# Patient Record
Sex: Female | Born: 1995 | Hispanic: Yes | Marital: Single | Smoking: Never smoker
Health system: Southern US, Community
[De-identification: ages and names within clinical notes are randomized; demographics above are authoritative.]

## PROBLEM LIST (undated history)

## (undated) DIAGNOSIS — Z23 Encounter for immunization: Secondary | ICD-10-CM

## (undated) DIAGNOSIS — R87629 Unspecified abnormal cytological findings in specimens from vagina: Secondary | ICD-10-CM

## (undated) DIAGNOSIS — A630 Anogenital (venereal) warts: Secondary | ICD-10-CM

## (undated) DIAGNOSIS — J45909 Unspecified asthma, uncomplicated: Secondary | ICD-10-CM

## (undated) DIAGNOSIS — R0600 Dyspnea, unspecified: Secondary | ICD-10-CM

## (undated) DIAGNOSIS — F419 Anxiety disorder, unspecified: Secondary | ICD-10-CM

## (undated) DIAGNOSIS — A749 Chlamydial infection, unspecified: Secondary | ICD-10-CM

## (undated) DIAGNOSIS — L709 Acne, unspecified: Secondary | ICD-10-CM

## (undated) DIAGNOSIS — F32A Depression, unspecified: Secondary | ICD-10-CM

## (undated) HISTORY — PX: NO PAST SURGERIES: SHX2092

## (undated) HISTORY — DX: Chlamydial infection, unspecified: A74.9

## (undated) HISTORY — DX: Acne, unspecified: L70.9

## (undated) HISTORY — DX: Anxiety disorder, unspecified: F41.9

## (undated) HISTORY — DX: Encounter for immunization: Z23

## (undated) HISTORY — DX: Dyspnea, unspecified: R06.00

## (undated) HISTORY — DX: Unspecified abnormal cytological findings in specimens from vagina: R87.629

## (undated) HISTORY — PX: CERVICAL BIOPSY: SHX590

## (undated) HISTORY — DX: Anogenital (venereal) warts: A63.0

## (undated) HISTORY — DX: Depression, unspecified: F32.A

## (undated) HISTORY — DX: Unspecified asthma, uncomplicated: J45.909

---
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
## 2020-08-29 DIAGNOSIS — E282 Polycystic ovarian syndrome: Secondary | ICD-10-CM

## 2020-08-29 HISTORY — DX: Polycystic ovarian syndrome: E28.2

## 2020-09-30 ENCOUNTER — Telehealth: Payer: Self-pay

## 2020-09-30 NOTE — Telephone Encounter (Signed)
[facility] referring for Genital Warts desires cryotherapy for removal . Called and left voicemail for patient to call back to be scheduled.

## 2020-10-09 ENCOUNTER — Encounter: Payer: Self-pay | Admitting: Obstetrics and Gynecology

## 2020-10-13 ENCOUNTER — Encounter: Payer: Self-pay | Admitting: Obstetrics and Gynecology

## 2020-10-13 ENCOUNTER — Ambulatory Visit (INDEPENDENT_AMBULATORY_CARE_PROVIDER_SITE_OTHER): Payer: 59 | Admitting: Obstetrics and Gynecology

## 2020-10-13 ENCOUNTER — Other Ambulatory Visit: Payer: Self-pay

## 2020-10-13 VITALS — BP 115/70 | Ht 60.5 in | Wt 232.4 lb

## 2020-10-13 DIAGNOSIS — A630 Anogenital (venereal) warts: Secondary | ICD-10-CM

## 2020-10-13 NOTE — Patient Instructions (Addendum)
Human Papillomavirus Quadrivalent Vaccine suspension for injection What is this medicine? HUMAN PAPILLOMAVIRUS VACCINE (HYOO muhn pap uh LOH muh vahy ruhs vak SEEN) is a vaccine. It is used to prevent infections of four types of the human papillomavirus. In women, the vaccine may lower your risk of getting cervical, vaginal, vulvar, or anal cancer and genital warts. In men, the vaccine may lower your risk of getting genital warts and anal cancer. You cannot get these diseases from the vaccine. This vaccine does not treat these diseases. This medicine may be used for other purposes; ask your health care provider or pharmacist if you have questions. COMMON BRAND NAME(S): Gardasil What should I tell my health care provider before I take this medicine? They need to know if you have any of these conditions:  fever or infection  hemophilia  HIV infection or AIDS  immune system problems  low platelet count  an unusual reaction to Human Papillomavirus Vaccine, yeast, other medicines, foods, dyes, or preservatives  pregnant or trying to get pregnant  breast-feeding How should I use this medicine? This vaccine is for injection in a muscle on your upper arm or thigh. It is given by a health care professional. You will be observed for 15 minutes after each dose. Sometimes, fainting happens after the vaccine is given. You may be asked to sit or lie down during the 15 minutes. Three doses are given. The second dose is given 2 months after the first dose. The last dose is given 4 months after the second dose. A copy of a Vaccine Information Statement will be given before each vaccination. Read this sheet carefully each time. The sheet may change frequently. Talk to your pediatrician regarding the use of this medicine in children. While this drug may be prescribed for children as young as 9 years of age for selected conditions, precautions do apply. Overdosage: If you think you have taken too much of this  medicine contact a poison control center or emergency room at once. NOTE: This medicine is only for you. Do not share this medicine with others. What if I miss a dose? All 3 doses of the vaccine should be given within 6 months. Remember to keep appointments for follow-up doses. Your health care provider will tell you when to return for the next vaccine. Ask your health care professional for advice if you are unable to keep an appointment or miss a scheduled dose. What may interact with this medicine?  other vaccines This list may not describe all possible interactions. Give your health care provider a list of all the medicines, herbs, non-prescription drugs, or dietary supplements you use. Also tell them if you smoke, drink alcohol, or use illegal drugs. Some items may interact with your medicine. What should I watch for while using this medicine? This vaccine may not fully protect everyone. Continue to have regular pelvic exams and cervical or anal cancer screenings as directed by your doctor. The Human Papillomavirus is a sexually transmitted disease. It can be passed by any kind of sexual activity that involves genital contact. The vaccine works best when given before you have any contact with the virus. Many people who have the virus do not have any signs or symptoms. Tell your doctor or health care professional if you have any reaction or unusual symptom after getting the vaccine. What side effects may I notice from receiving this medicine? Side effects that you should report to your doctor or health care professional as soon as possible:    allergic reactions like skin rash, itching or hives, swelling of the face, lips, or tongue  breathing problems  feeling faint or lightheaded, falls Side effects that usually do not require medical attention (report to your doctor or health care professional if they continue or are  bothersome):  cough  dizziness  fever  headache  nausea  redness, warmth, swelling, pain, or itching at site where injected This list may not describe all possible side effects. Call your doctor for medical advice about side effects. You may report side effects to FDA at 1-800-FDA-1088. Where should I keep my medicine? This drug is given in a hospital or clinic and will not be stored at home. NOTE: This sheet is a summary. It may not cover all possible information. If you have questions about this medicine, talk to your doctor, pharmacist, or health care provider.  2021 Elsevier/Gold Standard (2013-10-07 13:14:33) Cryosurgery for Skin Conditions Cryosurgery, also called cryotherapy, is the use of extremely cold liquid (liquid nitrogen) to freeze and remove abnormal or diseased tissue. Cryosurgery may be used to remove certain growths on the skin, such as:  Warts.  Skin sores that could turn into cancer (precancerous skin lesions or actinic keratoses).  Some skin cancers. Cryosurgery usually takes a few minutes, and it can be done in your health care provider's office. Tell a health care provider about:  Any allergies you have.  All medicines you are taking, including vitamins, herbs, eye drops, creams, and over-the-counter medicines.  Any problems you or family members have had with anesthetic medicines.  Any blood disorders you have.  Any surgeries you have had.  Any medical conditions you have.  Whether you are pregnant or may be pregnant. What are the risks? Generally, this is a safe procedure. However, problems may occur, including:  Infection.  Bleeding.  Scarring.  Changes in skin color (lighter or darker than normal skin tone).  Swelling.  Hair loss in the treated area.  Damage to nearby structures or organs, such as nerve damage and loss of feeling. This is rare. What happens before the procedure? No specific preparation is needed for this  procedure. Your health care provider will describe the procedure and will discuss the benefits and risks of the procedure with you. What happens during the procedure?  Your procedure will be performed using one of the following methods: ? Your health care provider may apply a device (probe) to the skin. The probe has liquid nitrogen flowing through it to cool it down. The probe will be applied to the skin until the skin is frozen and destroyed. ? Your health care provider may apply liquid nitrogen to the skin with a swab or by spraying it on the skin until the skin is frozen and destroyed.  The treated area may be covered with a bandage (dressing). These procedures may vary among health care providers and clinics.   What can I expect after procedure? After your procedure, it is common to have redness, swelling, and a blister that forms over the treated area. The blister may contain a small amount of blood. You may also have some mild stinging or a burning sensation that will resolve. If a blister forms, it will break open on its own after about 2-4 weeks, leaving a scab. Then the treated area will heal. After healing, there is usually little or no scarring. Follow these instructions at home: Caring for the treated area  Follow instructions from your health care provider about how to take care   of the treated area. If you have a dressing, make sure you: ? Wash your hands with soap and water for at least 20 seconds before and after you change your dressing. If soap and water are not available, use hand sanitizer. ? Change your dressing as told by your health care provider. ? Keep the dressing and the treated area clean and dry. If the dressing gets wet, change it right away. ? Clean the treated area with soap and water. ? Keep the area covered with a dressing until it heals, or for as long as told by your health care provider.  Check the treated area every day for signs of infection. Check  for: ? More redness, swelling, or pain. ? More fluid or blood. ? Warmth. ? Pus or a bad smell.  If a blister forms, do not pick at your blister or try to break it open. Doing this can cause infection and scarring.  Do not apply any medicine, cream, or lotion to the treated area unless directed by your health care provider.   General instructions  Take over-the-counter and prescription medicines only as told by your health care provider.  Do not use any products that contain nicotine or tobacco, such as cigarettes, e-cigarettes, and chewing tobacco. These can delay healing. If you need help quitting, ask your health care provider.  Do not take baths, swim, use a hot tub, hand-wash dishes, or otherwise soak the treated area until your health care provider approves. Ask your health care provider if you may take showers. You may only be allowed to take sponge baths.  Keep all follow-up visits as told by your health care provider. This is important. Contact a health care provider if:  You have more redness, swelling, or pain around the treated area.  You have more fluid or blood coming from the treated area.  The treated area feels warm to the touch.  You have pus or a bad smell coming from the treated area.  Your blister becomes large and painful. Get help right away if:  You have a fever and have redness spreading from the treated area. Summary  Cryosurgery, also called cryotherapy, is the use of extreme cold (liquid nitrogen) to freeze and remove abnormal growths or diseased tissue.  Cryosurgery usually takes a few minutes, and it can be done in your health care provider's office.  Generally, this is a safe procedure that requires no specific preparation beforehand.  There are two different methods for performing cryosurgery. One method involves using a device (probe) to freeze the growth, and the other method involves applying liquid nitrogen directly to the growth.  After  treatment with cryotherapy, follow care instructions as provided by your health care provider. Watch for signs of infection. If a blister forms, do not pick at it or try to break it open. This information is not intended to replace advice given to you by your health care provider. Make sure you discuss any questions you have with your health care provider. Document Revised: 04/03/2019 Document Reviewed: 04/03/2019 Elsevier Patient Education  2021 Elsevier Inc.  

## 2020-10-13 NOTE — Progress Notes (Signed)
Patient ID: Angela Lawrence, female   DOB: 31-Oct-1995, 25 y.o.   MRN: [id number]  Reason for Consult: Gynecologic Exam   Referred by Gorden Harms, PA-C  Subjective:     HPI:  Angela Lawrence is a 25 y.o. female. She was referred from the health department for cryotherapy. She reports she has one genital wart and she would like to have it removed.   Past Medical History:  Diagnosis Date  . PCOS (polycystic ovarian syndrome) 08/2020   History reviewed. No pertinent family history. History reviewed. No pertinent surgical history.  Short Social History:  Social History   Tobacco Use  . Smoking status: Never Smoker  . Smokeless tobacco: Never Used  Substance Use Topics  . Alcohol use: Yes    Comment: occ    Not on File  No current outpatient medications on file.   No current facility-administered medications for this visit.    Review of Systems  Constitutional: Negative for chills, fatigue, fever and unexpected weight change.  HENT: Negative for trouble swallowing.  Eyes: Negative for loss of vision.  Respiratory: Negative for cough, shortness of breath and wheezing.  Cardiovascular: Negative for chest pain, leg swelling, palpitations and syncope.  GI: Negative for abdominal pain, blood in stool, diarrhea, nausea and vomiting.  GU: Negative for difficulty urinating, dysuria, frequency and hematuria.  Musculoskeletal: Negative for back pain, leg pain and joint pain.  Skin: Negative for rash.  Neurological: Negative for dizziness, headaches, light-headedness, numbness and seizures.  Psychiatric: Negative for behavioral problem, confusion, depressed mood and sleep disturbance.        Objective:  Objective   Vitals:   10/13/20 1529  BP: 115/70  Weight: 232 lb 6.4 oz (105.4 kg)  Height: 5' 0.5" (1.537 m)   Body mass index is 44.64 kg/m.  Physical Exam Vitals and nursing note reviewed.  Constitutional:      Appearance: She is  well-developed and well-nourished.  HENT:     Head: Normocephalic and atraumatic.  Eyes:     Extraocular Movements: EOM normal.     Pupils: Pupils are equal, round, and reactive to light.  Cardiovascular:     Rate and Rhythm: Normal rate and regular rhythm.  Pulmonary:     Effort: Pulmonary effort is normal. No respiratory distress.  Skin:    General: Skin is warm and dry.  Neurological:     Mental Status: She is alert and oriented to person, place, and time.  Psychiatric:        Mood and Affect: Mood and affect normal.        Behavior: Behavior normal.        Thought Content: Thought content normal.        Judgment: Judgment normal.      GYNECOLOGY CLINIC PROCEDURE NOTE  Cryotherapy details  Indication: Pt has a history of Genital Warts  The indications for cryotherapy were reviewed with the patient in detail. She was counseled about that efficacy of this procedure, and possible need for repeat cryotherapy. The risks of the procedure where explained in detail and recovery was explained. All her questions were answered, and written informed consent was obtained.  The patient was placed in the dorsal lithotomy position. Genital warts were visualized. The appropriate cryotherapy probes were picked and then affixed to cryotherapy apparatus. Nitrogen gas was then activated, the probe was applied to the genital warts. This was kept in place for 30 seconds on each lesion. 1 lesions were treated in total.  The cryotherapy was then stopped and all instruments were removed.   The patient tolerated the procedure well without any complications. Routine post procedure instructions were given to the patient.  Will follow up in 1 month.    Assessment/Plan:     25 yo with genital wart Cryotherapy performed today  Follow up in 4 weeks.   Adelene Idler MD [facility], [facility] 10/13/2020 4:01 PM

## 2020-10-16 ENCOUNTER — Encounter: Payer: Self-pay | Admitting: Obstetrics and Gynecology

## 2020-10-16 ENCOUNTER — Telehealth: Payer: Self-pay

## 2020-10-16 NOTE — Telephone Encounter (Signed)
sent 

## 2020-10-16 NOTE — Telephone Encounter (Signed)
Pt calling; wants note for work for today b/c of procedure she had done Tues; is swollen down there today; her job requires sitting down; doesn't want to tell them she has a problem down there.  She told work she had an appt with [place] today; please send note thru MyChart. [phone]

## 2020-10-23 ENCOUNTER — Encounter: Payer: Self-pay | Admitting: Obstetrics and Gynecology

## 2020-11-13 ENCOUNTER — Ambulatory Visit: Payer: 59 | Admitting: Obstetrics and Gynecology

## 2020-11-24 ENCOUNTER — Ambulatory Visit: Payer: 59 | Admitting: Obstetrics and Gynecology

## 2020-12-25 ENCOUNTER — Ambulatory Visit: Payer: 59 | Admitting: Obstetrics and Gynecology

## 2022-09-27 ENCOUNTER — Telehealth: Payer: Self-pay

## 2022-09-27 NOTE — Telephone Encounter (Signed)
Pt calling to see if we are accepting new pts.  [phone]  Left detailed msg that she is not considered a new pt since we last saw her in 2022.  If she is calling for a friend - if preg we will take her but if not would need a referral.

## 2022-11-16 NOTE — Progress Notes (Unsigned)
   PCP:  Crissie Figures, PA-C   No chief complaint on file.    HPI:      Ms. Angela Lawrence is a 27 y.o. No obstetric history on file. whose LMP was No LMP recorded., presents today for her annual examination.  Her menses are {norm/abn:715}, lasting {number: 22536} days.  Dysmenorrhea {dysmen:716}. She {does:18564} have intermenstrual bleeding.  Sex activity: {sex active: 315163}.  Last Pap: [id number]  Results were: {norm/abn:16707::"no abnormalities"} /neg HPV DNA *** Hx of STDs: ext HPV with cryotx 2022  There is no FH of breast cancer. There is no FH of ovarian cancer. The patient {does:18564} do self-breast exams.  Tobacco use: {tob:20664} Alcohol use: {Alcohol:11675} No drug use.  Exercise: {exercise:31265}  She {does:18564} get adequate calcium and Vitamin D in her diet.  There are no problems to display for this patient.   No past surgical history on file.  No family history on file.  Social History   Socioeconomic History   Marital status: Single    Spouse name: Not on file   Number of children: Not on file   Years of education: Not on file   Highest education level: Not on file  Occupational History   Not on file  Tobacco Use   Smoking status: Never   Smokeless tobacco: Never  Vaping Use   Vaping Use: Never used  Substance and Sexual Activity   Alcohol use: Yes    Comment: occ   Drug use: Never   Sexual activity: Yes    Birth control/protection: Condom  Other Topics Concern   Not on file  Social History Narrative   ** Merged History Encounter **       Social Determinants of Health   Financial Resource Strain: Not on file  Food Insecurity: Not on file  Transportation Needs: Not on file  Physical Activity: Not on file  Stress: Not on file  Social Connections: Not on file  Intimate Partner Violence: Not on file    No current outpatient medications on file.     ROS:  Review of Systems BREAST: No  symptoms   Objective: There were no vitals taken for this visit.   OBGyn Exam  Results: No results found for this or any previous visit (from the past 24 hour(s)).  Assessment/Plan: No diagnosis found.  No orders of the defined types were placed in this encounter.            GYN counsel {counseling: 16159}     F/U  No follow-ups on file.  Warda Mcqueary B. Fredi Geiler, PA-C 11/16/2022 5:43 PM

## 2022-11-17 ENCOUNTER — Other Ambulatory Visit (HOSPITAL_COMMUNITY)
Admission: RE | Admit: 2022-11-17 | Discharge: 2022-11-17 | Disposition: A | Discharge status: Home / Self Care | Payer: BC Managed Care – PPO | Source: Ambulatory Visit | Attending: Obstetrics and Gynecology | Admitting: Obstetrics and Gynecology

## 2022-11-17 ENCOUNTER — Encounter: Payer: Self-pay | Admitting: Obstetrics and Gynecology

## 2022-11-17 ENCOUNTER — Ambulatory Visit (INDEPENDENT_AMBULATORY_CARE_PROVIDER_SITE_OTHER): Payer: BC Managed Care – PPO | Admitting: Obstetrics and Gynecology

## 2022-11-17 VITALS — BP 100/80 | Ht 66.0 in | Wt 232.0 lb

## 2022-11-17 DIAGNOSIS — Z113 Encounter for screening for infections with a predominantly sexual mode of transmission: Secondary | ICD-10-CM | POA: Insufficient documentation

## 2022-11-17 DIAGNOSIS — Z30432 Encounter for removal of intrauterine contraceptive device: Secondary | ICD-10-CM | POA: Diagnosis not present

## 2022-11-17 DIAGNOSIS — A630 Anogenital (venereal) warts: Secondary | ICD-10-CM

## 2022-11-17 DIAGNOSIS — Z124 Encounter for screening for malignant neoplasm of cervix: Secondary | ICD-10-CM | POA: Insufficient documentation

## 2022-11-17 DIAGNOSIS — Z01419 Encounter for gynecological examination (general) (routine) without abnormal findings: Secondary | ICD-10-CM | POA: Diagnosis not present

## 2022-11-17 DIAGNOSIS — Z30013 Encounter for initial prescription of injectable contraceptive: Secondary | ICD-10-CM

## 2022-11-17 DIAGNOSIS — R1031 Right lower quadrant pain: Secondary | ICD-10-CM

## 2022-11-17 MED ORDER — MEDROXYPROGESTERONE ACETATE 150 MG/ML IM SUSY
150.0000 mg | PREFILLED_SYRINGE | INTRAMUSCULAR | Status: AC
  Administered 2022-11-17: 150 mg via INTRAMUSCULAR

## 2022-11-17 NOTE — Patient Instructions (Signed)
I value your feedback and you entrusting us with your care. If you get a [facility] patient survey, I would appreciate you taking the time to let us know about your experience today. Thank you! ? ? ?

## 2022-11-24 LAB — CYTOLOGY - PAP
Comment: NORMAL
Neisseria Gonorrhea: NEGATIVE

## 2022-11-24 NOTE — Progress Notes (Signed)
Pls have lab add HPV DNA. Thx.

## 2022-11-25 LAB — CYTOLOGY - PAP
Chlamydia: NEGATIVE
Comment: NEGATIVE
Comment: NORMAL
Neisseria Gonorrhea: NEGATIVE

## 2022-12-01 ENCOUNTER — Ambulatory Visit (INDEPENDENT_AMBULATORY_CARE_PROVIDER_SITE_OTHER): Payer: BC Managed Care – PPO

## 2022-12-01 DIAGNOSIS — R1031 Right lower quadrant pain: Secondary | ICD-10-CM | POA: Diagnosis not present

## 2022-12-01 DIAGNOSIS — Z719 Counseling, unspecified: Secondary | ICD-10-CM | POA: Diagnosis not present

## 2022-12-05 NOTE — Progress Notes (Signed)
Pt aware of pap results. Admin, pls call pt to schedule colpo with MD. She is aware.

## 2022-12-06 ENCOUNTER — Telehealth: Payer: Self-pay | Admitting: Obstetrics and Gynecology

## 2022-12-06 NOTE — Telephone Encounter (Signed)
The patient is scheduled for 5/15 with [facility]

## 2022-12-06 NOTE — Telephone Encounter (Signed)
I contacted this patient to scheduled for her Colposcopy. Dr. Valentino Saxon has an opening on 5/8 at 10:15 am that I was going to offer. I had to leave voicemail for the patient to call back to be scheduled.

## 2022-12-09 DIAGNOSIS — Z1389 Encounter for screening for other disorder: Secondary | ICD-10-CM | POA: Diagnosis not present

## 2022-12-09 DIAGNOSIS — F331 Major depressive disorder, recurrent, moderate: Secondary | ICD-10-CM | POA: Diagnosis not present

## 2022-12-23 DIAGNOSIS — Z1389 Encounter for screening for other disorder: Secondary | ICD-10-CM | POA: Diagnosis not present

## 2022-12-23 DIAGNOSIS — F331 Major depressive disorder, recurrent, moderate: Secondary | ICD-10-CM | POA: Diagnosis not present

## 2023-01-10 NOTE — Progress Notes (Deleted)
    GYNECOLOGY OFFICE COLPOSCOPY PROCEDURE NOTE  27 y.o. G0P0000 here for colposcopy for low-grade squamous intraepithelial neoplasia (LGSIL - encompassing HPV,mild dysplasia,CIN I) pap smear on 11/17/2022. Discussed role for HPV in cervical dysplasia, need for surveillance.  Patient gave informed written consent, time out was performed.  Placed in lithotomy position. Cervix viewed with speculum and colposcope after application of acetic acid.   Colposcopy adequate? {yes/no:20286}  {Findings; colposcopy:728}; corresponding biopsies obtained.  ECC specimen obtained. All specimens were labeled and sent to pathology.  Chaperone was present during entire procedure.  Patient was given post procedure instructions.  Will follow up pathology and manage accordingly; patient will be contacted with results and recommendations.  Routine preventative health maintenance measures emphasized.    Hildred Laser, MD [facility]

## 2023-01-11 ENCOUNTER — Ambulatory Visit: Payer: BC Managed Care – PPO | Admitting: Obstetrics and Gynecology

## 2023-01-11 NOTE — Telephone Encounter (Signed)
I contacted the patient via phone about the missed appointment for today, 5/15 with Dr. Valentino Saxon. I left message for the patient to call back to rescheduled. Opening in August for next available appointments.

## 2023-02-05 ENCOUNTER — Ambulatory Visit
Admission: RE | Admit: 2023-02-05 | Discharge: 2023-02-05 | Disposition: A | Discharge status: Home / Self Care | Payer: BC Managed Care – PPO | Source: Ambulatory Visit | Attending: Urgent Care | Admitting: Urgent Care

## 2023-02-05 VITALS — BP 116/79 | HR 82 | Temp 99.7°F | Resp 16

## 2023-02-05 DIAGNOSIS — R079 Chest pain, unspecified: Secondary | ICD-10-CM | POA: Diagnosis not present

## 2023-02-05 DIAGNOSIS — R0789 Other chest pain: Secondary | ICD-10-CM | POA: Diagnosis not present

## 2023-02-05 DIAGNOSIS — R0602 Shortness of breath: Secondary | ICD-10-CM

## 2023-02-05 MED ORDER — LIDOCAINE VISCOUS HCL 2 % MT SOLN
15.0000 mL | Freq: Once | OROMUCOSAL | Status: AC
  Administered 2023-02-05: 15 mL via OROMUCOSAL

## 2023-02-05 MED ORDER — ALUMINUM & MAGNESIUM HYDROXIDE 200-200 MG/5ML PO SUSP
30.0000 mL | Freq: Once | ORAL | Status: AC
  Administered 2023-02-05: 30 mL via ORAL

## 2023-02-05 NOTE — ED Triage Notes (Signed)
Pt c/o left side chest pain for the past 2 days. She describes the as a burning sensation. It hurts her to take a deep breath in. She reports SOB and lightheadedness with the chest pain. Pt denies taking any medication for her symptoms.

## 2023-02-05 NOTE — Discharge Instructions (Addendum)
Recommended use of pain relievers such as ibuprofen or naproxen for relief of your chest wall pain.  If you develop worsening symptoms, go to the emergency room.

## 2023-02-05 NOTE — ED Provider Notes (Signed)
[facility] Angela Lawrence    CSN: [id number] Arrival date & time: 02/05/23  1012      History   Chief Complaint Chief Complaint  Patient presents with   Chest Pain    HPI Angela Lawrence is a 27 y.o. female.    Chest Pain   Patient presents to urgent care with complaint of left-sided chest pain x 2 days.  Describes pain as "a burning sensation".  Endorses pain when taking a deep breath and reports feeling of shortness of breath and lightheadedness.  Patient endorses PMH including GERD but denies symptoms of GERD currently.  No taste of acid or increased burping.  No epigastric pain.  Past Medical History:  Diagnosis Date   Acne    Chlamydia    Genital warts    PCOS (polycystic ovarian syndrome) 08/2020   Vaccine for human papilloma virus (HPV) types 6, 11, 16, and 18 administered     Patient Active Problem List   Diagnosis Date Noted   Genital warts 11/17/2022    Past Surgical History:  Procedure Laterality Date   NO PAST SURGERIES      OB History     Gravida  0   Para  0   Term  0   Preterm  0   AB  0   Living  0      SAB  0   IAB  0   Ectopic  0   Multiple  0   Live Births  0            Home Medications    Prior to Admission medications   Not on File    Family History No family history on file.  Social History Social History   Tobacco Use   Smoking status: Never   Smokeless tobacco: Never  Vaping Use   Vaping Use: Never used  Substance Use Topics   Alcohol use: Not Currently   Drug use: Never     Allergies   Patient has no known allergies.   Review of Systems Review of Systems  Cardiovascular:  Positive for chest pain.     Physical Exam Triage Vital Signs ED Triage Vitals  Enc Vitals Group     BP 02/05/23 1036 116/79     Pulse Rate 02/05/23 1036 82     Resp 02/05/23 1036 16     Temp 02/05/23 1036 99.7 F (37.6 C)     Temp Source 02/05/23 1036 Oral     SpO2 02/05/23 1036 97 %     Weight --       Height --      Head Circumference --      Peak Flow --      Pain Score 02/05/23 1034 0     Pain Loc --      Pain Edu? --      Excl. in GC? --    No data found.  Updated Vital Signs BP 116/79 (BP Location: Left Arm)   Pulse 82   Temp 99.7 F (37.6 C) (Oral)   Resp 16   SpO2 97%   Visual Acuity Right Eye Distance:   Left Eye Distance:   Bilateral Distance:    Right Eye Near:   Left Eye Near:    Bilateral Near:     Physical Exam Vitals reviewed.  Constitutional:      Appearance: She is well-developed.  Cardiovascular:     Rate and Rhythm: Normal rate and regular rhythm.  Heart sounds: Normal heart sounds.  Pulmonary:     Effort: Pulmonary effort is normal.     Breath sounds: Normal breath sounds.  Chest:     Chest wall: Tenderness present. No mass.    Skin:    General: Skin is warm and dry.  Neurological:     General: No focal deficit present.     Mental Status: She is alert and oriented to person, place, and time.  Psychiatric:        Mood and Affect: Mood normal.        Behavior: Behavior normal.      UC Treatments / Results  Labs (all labs ordered are listed, but only abnormal results are displayed) Labs Reviewed - No data to display  EKG   Radiology No results found.  Procedures Procedures (including critical care time)  Medications Ordered in UC Medications - No data to display  Initial Impression / Assessment and Plan / UC Course  I have reviewed the triage vital signs and the nursing notes.  Pertinent labs & imaging results that were available during my care of the patient were reviewed by me and considered in my medical decision making (see chart for details).   Angela Lawrence is a 27 y.o. female presenting with L sided chest wall pain. Patient is afebrile without recent antipyretics, satting well on room air. Overall is well appearing though non-toxic, well hydrated, without respiratory distress. Pulmonary exam is  unremarkable.  Lungs CTAB without wheezing, rhonchi, rales. RRR.  Left sided chest wall pain is present.  Point tenderness on the upper left breast with palpation.  Reviewed relevant chart history.   ECG is reassuring.  Normal sinus rhythm.  Low suspicion for ACS or other cardiac etiology given her age and past medical history.  GI cocktail consisting of mag aluminum and viscous lidocaine was administered top differential diagnosis is costochondritis of unknown etiology.  She denies any new physical activities or exercise regimens.  No new activities at work or home.  Recommend use of OTC NSAIDs for relief of her chest wall pain.  Counseled patient on potential for adverse effects with medications prescribed/recommended today, ER and return-to-clinic precautions discussed, patient verbalized understanding and agreement with care plan.  Final Clinical Impressions(s) / UC Diagnoses   Final diagnoses:  Chest pain, unspecified type  SOB (shortness of breath)   Discharge Instructions   None    ED Prescriptions   None    PDMP not reviewed this encounter.   Charma Igo, [place] 02/05/23 1108

## 2023-02-09 ENCOUNTER — Ambulatory Visit: Payer: BC Managed Care – PPO

## 2023-12-11 DIAGNOSIS — L83 Acanthosis nigricans: Secondary | ICD-10-CM | POA: Insufficient documentation

## 2023-12-11 DIAGNOSIS — E282 Polycystic ovarian syndrome: Secondary | ICD-10-CM | POA: Insufficient documentation

## 2023-12-11 DIAGNOSIS — E559 Vitamin D deficiency, unspecified: Secondary | ICD-10-CM | POA: Insufficient documentation

## 2024-04-05 ENCOUNTER — Ambulatory Visit: Payer: Self-pay

## 2024-05-24 ENCOUNTER — Ambulatory Visit

## 2024-05-24 VITALS — BP 124/65 | Ht 65.0 in | Wt 242.0 lb

## 2024-05-24 DIAGNOSIS — Z308 Encounter for other contraceptive management: Secondary | ICD-10-CM | POA: Diagnosis not present

## 2024-05-24 DIAGNOSIS — Z3201 Encounter for pregnancy test, result positive: Secondary | ICD-10-CM | POA: Diagnosis not present

## 2024-05-24 LAB — PREGNANCY, URINE: Preg Test, Ur: POSITIVE — AB

## 2024-05-24 MED ORDER — PRENATAL 27-0.8 MG PO TABS: 1.0000 | ORAL_TABLET | Freq: Every day | ORAL | Status: AC

## 2024-05-24 NOTE — Progress Notes (Signed)
 UPT positive. Plans to receive prenatal care at [facility]; sent to clerk for preadmission.   The patient was dispensed prenatal vitamins #100 today. I provided counseling today regarding the medication. We discussed the medication, the side effects and when to call clinic.   Positive pregnancy packet reviewed and given to patient. Also counseled on hydration and when to seek medical attention.   Patient given the opportunity to ask questions. Questions answered.   Abagail Kitchens, RN

## 2024-06-12 ENCOUNTER — Emergency Department

## 2024-06-12 ENCOUNTER — Other Ambulatory Visit: Payer: Self-pay

## 2024-06-12 ENCOUNTER — Emergency Department
Admission: EM | Admit: 2024-06-12 | Discharge: 2024-06-12 | Disposition: A | Discharge status: Home / Self Care | Attending: Emergency Medicine | Admitting: Emergency Medicine

## 2024-06-12 DIAGNOSIS — Z3A08 8 weeks gestation of pregnancy: Secondary | ICD-10-CM | POA: Diagnosis not present

## 2024-06-12 DIAGNOSIS — R1031 Right lower quadrant pain: Secondary | ICD-10-CM | POA: Diagnosis not present

## 2024-06-12 DIAGNOSIS — J069 Acute upper respiratory infection, unspecified: Secondary | ICD-10-CM | POA: Insufficient documentation

## 2024-06-12 DIAGNOSIS — O209 Hemorrhage in early pregnancy, unspecified: Secondary | ICD-10-CM | POA: Insufficient documentation

## 2024-06-12 DIAGNOSIS — O99511 Diseases of the respiratory system complicating pregnancy, first trimester: Secondary | ICD-10-CM | POA: Insufficient documentation

## 2024-06-12 DIAGNOSIS — B9789 Other viral agents as the cause of diseases classified elsewhere: Secondary | ICD-10-CM | POA: Insufficient documentation

## 2024-06-12 DIAGNOSIS — O4690 Antepartum hemorrhage, unspecified, unspecified trimester: Secondary | ICD-10-CM

## 2024-06-12 LAB — COMPREHENSIVE METABOLIC PANEL WITH GFR
ALT: 17 U/L (ref 0–44)
AST: 19 U/L (ref 15–41)
Albumin: 4 g/dL (ref 3.5–5.0)
Alkaline Phosphatase: 38 U/L (ref 38–126)
Anion gap: 12 (ref 5–15)
BUN: 9 mg/dL (ref 6–20)
CO2: 21 mmol/L — ABNORMAL LOW (ref 22–32)
Calcium: 8.9 mg/dL (ref 8.9–10.3)
Chloride: 104 mmol/L (ref 98–111)
Creatinine, Ser: 0.52 mg/dL (ref 0.44–1.00)
GFR, Estimated: >60 mL/min (ref >60)
Glucose, Bld: 129 mg/dL — ABNORMAL HIGH (ref 70–99)
Potassium: 3.5 mmol/L (ref 3.5–5.1)
Sodium: 137 mmol/L (ref 135–145)
Total Bilirubin: 0.4 mg/dL (ref 0.0–1.2)
Total Protein: 7.3 g/dL (ref 6.5–8.1)

## 2024-06-12 LAB — ABO/RH: ABO/RH(D): O POS

## 2024-06-12 LAB — URINALYSIS, ROUTINE W REFLEX MICROSCOPIC
Bilirubin Urine: NEGATIVE
Glucose, UA: NEGATIVE mg/dL
Ketones, ur: NEGATIVE mg/dL
Nitrite: NEGATIVE
Protein, ur: NEGATIVE mg/dL
Specific Gravity, Urine: 1.02 (ref 1.005–1.030)
pH: 7 (ref 5.0–8.0)

## 2024-06-12 LAB — CBC
HCT: 41.1 % (ref 36.0–46.0)
Hemoglobin: 14 g/dL (ref 12.0–15.0)
MCH: 29.8 pg (ref 26.0–34.0)
MCHC: 34.1 g/dL (ref 30.0–36.0)
MCV: 87.4 fL (ref 80.0–100.0)
Platelets: 269 K/uL (ref 150–400)
RBC: 4.7 MIL/uL (ref 3.87–5.11)
RDW: 12.3 % (ref 11.5–15.5)
WBC: 10.1 K/uL (ref 4.0–10.5)
nRBC: 0 % (ref 0.0–0.2)

## 2024-06-12 LAB — HCG, QUANTITATIVE, PREGNANCY: hCG, Beta Chain, Quant, S: 157395 m[IU]/mL — ABNORMAL HIGH (ref <5)

## 2024-06-12 LAB — LIPASE, BLOOD: Lipase: 29 U/L (ref 11–51)

## 2024-06-12 LAB — RESP PANEL BY RT-PCR (RSV, FLU A&B, COVID)  RVPGX2
Influenza A by PCR: NEGATIVE
Influenza B by PCR: NEGATIVE
Resp Syncytial Virus by PCR: NEGATIVE
SARS Coronavirus 2 by RT PCR: NEGATIVE

## 2024-06-12 LAB — POC URINE PREG, ED: Preg Test, Ur: POSITIVE — AB

## 2024-06-12 LAB — GROUP A STREP BY PCR: Group A Strep by PCR: NOT DETECTED

## 2024-06-12 NOTE — Discharge Instructions (Signed)
 While you are pregnant you may only take allergy medicine for your cold symptoms.  If you begin to cough you can use Robitussin.  Tylenol for body aches, pain or fever.  Drink plenty of fluids. Your ultrasound today was reassuring Your labs are reassuring, you does not have strep throat, COVID, influenza, or RSV

## 2024-06-12 NOTE — ED Provider Notes (Signed)
 [facility] Provider Note    Event Date/Time   First MD Initiated Contact with Patient 06/12/24 [phone]     (approximate)   History   Sore Throat, Vaginal Bleeding, and Abdominal Pain   HPI  Angela Lawrence is a 28 y.o. female G1, P0 presents emergency department with right lower abdominal pain, vaginal bleeding, states scant bleeding with pink color.  Also presents with fever, chills, sore throat and cough.  Unsure around anybody else is sick.  States somebody at work went home sick yesterday.  No vomiting or diarrhea.  Does have decreased appetite but that has been ongoing since she became pregnant.  Is followed at [facility]      Physical Exam   Triage Vital Signs: ED Triage Vitals  Encounter Vitals Group     BP 06/12/24 0737 122/79     Girls Systolic BP Percentile --      Girls Diastolic BP Percentile --      Boys Systolic BP Percentile --      Boys Diastolic BP Percentile --      Pulse Rate 06/12/24 0737 79     Resp 06/12/24 0737 20     Temp 06/12/24 0737 98 F (36.7 C)     Temp Source 06/12/24 0737 Oral     SpO2 06/12/24 0737 100 %     Weight 06/12/24 0739 238 lb (108 kg)     Height 06/12/24 0739 5' 5 (1.651 m)     Head Circumference --      Peak Flow --      Pain Score 06/12/24 0737 4     Pain Loc --      Pain Education --      Exclude from Growth Chart --     Most recent vital signs: Vitals:   06/12/24 1110 06/12/24 1138  BP: 117/65 120/70  Pulse: 86 83  Resp: 18 17  Temp: 98.3 F (36.8 C) 98.1 F (36.7 C)  SpO2: 98% 98%     General: Awake, no distress.   CV:  Good peripheral perfusion.  Resp:  Normal effort.  Abd:  No distention.  Mildly tender right lower quadrant Other:      ED Results / Procedures / Treatments   Labs (all labs ordered are listed, but only abnormal results are displayed) Labs Reviewed  COMPREHENSIVE METABOLIC PANEL WITH GFR - Abnormal; Notable for the following components:      Result Value    CO2 21 (*)    Glucose, Bld 129 (*)    All other components within normal limits  URINALYSIS, ROUTINE W REFLEX MICROSCOPIC - Abnormal; Notable for the following components:   Color, Urine YELLOW (*)    APPearance CLOUDY (*)    Hgb urine dipstick MODERATE (*)    Leukocytes,Ua LARGE (*)    Bacteria, UA MANY (*)    All other components within normal limits  HCG, QUANTITATIVE, PREGNANCY - Abnormal; Notable for the following components:   hCG, Beta Chain, Quant, S [id number] (*)    All other components within normal limits  POC URINE PREG, ED - Abnormal; Notable for the following components:   Preg Test, Ur POSITIVE (*)    All other components within normal limits  GROUP A STREP BY PCR  RESP PANEL BY RT-PCR (RSV, FLU A&B, COVID)  RVPGX2  LIPASE, BLOOD  CBC  ABO/RH     EKG     RADIOLOGY Ultrasound OB less than 14-week  PROCEDURES:   Procedures  Critical Care:  no Chief Complaint  Patient presents with   Sore Throat   Vaginal Bleeding   Abdominal Pain      MEDICATIONS ORDERED IN ED: Medications - No data to display   IMPRESSION / MDM / ASSESSMENT AND PLAN / ED COURSE  I reviewed the triage vital signs and the nursing notes.                              Differential diagnosis includes, but is not limited to, vaginal bleeding in pregnancy, ectopic, threatened miscarriage, subchorionic hemorrhage, COVID, influenza, RSV  Patient's presentation is most consistent with acute illness / injury with system symptoms.   Cardiac monitor no Medications given:  Labs, imaging ordered   Patient's labs are reassuring  Did independently review interpret the ultrasound OB less than 14 weeks, positive IUP, positive viable gestation, radiologist comments small subchorionic hemorrhage  I did explain all these findings to the patient.  She is to follow-up with her regular doctor.  Return emergency department for worsening.  She is in agreement treatment plan.  Gave her a work  note states she should not lift more than 10 to 15 pounds.  Discharged stable condition.   FINAL CLINICAL IMPRESSION(S) / ED DIAGNOSES   Final diagnoses:  Vaginal bleeding in pregnancy  Viral URI     Rx / DC Orders   ED Discharge Orders     None        Note:  This document was prepared using Dragon voice recognition software and may include unintentional dictation errors.    Gasper Devere ORN, PA-C 06/12/24 1407    Viviann Pastor, MD 06/13/24 [phone]

## 2024-06-12 NOTE — ED Triage Notes (Signed)
 Pt to ED for sore throat and vaginal spotting (pink) since last night at [redacted] weeks pregnant. States she had a fever but did not check temp. Also endorses sharp RLQ pain since last night. Hx PCOS.

## 2024-06-28 ENCOUNTER — Ambulatory Visit: Admitting: Family Medicine

## 2024-06-28 ENCOUNTER — Encounter: Payer: Self-pay | Admitting: Family Medicine

## 2024-06-28 VITALS — BP 101/70 | HR 70 | Temp 97.4°F | Ht 64.5 in | Wt 237.0 lb

## 2024-06-28 DIAGNOSIS — Z23 Encounter for immunization: Secondary | ICD-10-CM

## 2024-06-28 DIAGNOSIS — O208 Other hemorrhage in early pregnancy: Secondary | ICD-10-CM | POA: Diagnosis not present

## 2024-06-28 DIAGNOSIS — Z3401 Encounter for supervision of normal first pregnancy, first trimester: Secondary | ICD-10-CM | POA: Diagnosis not present

## 2024-06-28 DIAGNOSIS — O99210 Obesity complicating pregnancy, unspecified trimester: Secondary | ICD-10-CM | POA: Insufficient documentation

## 2024-06-28 DIAGNOSIS — N72 Inflammatory disease of cervix uteri: Secondary | ICD-10-CM

## 2024-06-28 DIAGNOSIS — O99211 Obesity complicating pregnancy, first trimester: Secondary | ICD-10-CM

## 2024-06-28 DIAGNOSIS — Z8709 Personal history of other diseases of the respiratory system: Secondary | ICD-10-CM

## 2024-06-28 DIAGNOSIS — Z3400 Encounter for supervision of normal first pregnancy, unspecified trimester: Secondary | ICD-10-CM | POA: Insufficient documentation

## 2024-06-28 DIAGNOSIS — Z8279 Family history of other congenital malformations, deformations and chromosomal abnormalities: Secondary | ICD-10-CM

## 2024-06-28 LAB — HEMOGLOBIN, FINGERSTICK: Hemoglobin: 13.6 g/dL (ref 11.1–15.9)

## 2024-06-28 MED ORDER — ASPIRIN 81 MG PO TBEC: 81.0000 mg | DELAYED_RELEASE_TABLET | Freq: Every day | ORAL | Status: AC

## 2024-06-28 NOTE — Progress Notes (Addendum)
 Centering Tour given. In house hgb result reviewed during visit. Aware of next Centering Group meeting time. The patient was dispensed Baby Aspirin today. I provided counseling today regarding the medication. We discussed the medication, the side effects and when to call clinic. Patient given the opportunity to ask questions. Questions answered.Peak Flow performed: 240, 300 and 300 with adequate effort. BTHIELE RN

## 2024-06-28 NOTE — Progress Notes (Signed)
 [facility] [address] [place] [address] Main phone: [phone]  Initial Prenatal Visit  Subjective:  Angela Lawrence is a 28 y.o. G1P0000 at [redacted]w[redacted]d being seen today to start prenatal care at the [facility]. The following medical issues will be considered in the care of this low-risk pregnancy:   Patient Active Problem List   Diagnosis Date Noted   Supervision of normal first pregnancy, antepartum 06/28/2024   Obesity affecting pregnancy, antepartum, pregravid BMI= 38.8 06/28/2024   Subchorionic hemorrhage of placenta in first trimester 06/28/2024   Family history of Down syndrome 06/28/2024   Chronic cervicitis 06/28/2024   History of asthma 06/28/2024   PCOS (polycystic ovarian syndrome) 12/11/2023   Vitamin D deficiency 12/11/2023   Acanthosis nigricans 12/11/2023   Genital warts 11/17/2022   Patient reports intermittent spotting and intermittent SOB.  Contractions: Not present. Vag. Bleeding: None, Scant (reports intermittent spotting).  Movement: Absent. Denies leaking of fluid.   Indications for ASA therapy One of the following: Previous pregnancy with preeclampsia, especially early onset and with an adverse outcome No  Multifetal gestation No  Chronic hypertension No  Type 1 or 2 diabetes mellitus No  Chronic kidney disease No  Autoimmune disease (antiphospholipid syndrome, systemic lupus erythematosus) No   Two or more of the following: Nulliparity  Yes  Obesity (body mass index >30 kg/m2) Yes  Family history of preeclampsia in mother or sister No  Age >=35 years No  Sociodemographic characteristics (African American race, low socioeconomic level) Yes  Personal risk factors (eg, previous pregnancy with low birth weight or small for gestational age infant, previous adverse pregnancy outcome [eg, stillbirth], interval >10 years between pregnancies) No   The following  portions of the patient's history were reviewed and updated as appropriate: allergies, current medications, past family history, past medical history, past social history, past surgical history and problem list. Problem list updated.  Objective:   Vitals:   06/28/24 0853 06/28/24 0857  BP: 101/70   Pulse: 70   Temp: (!) 97.4 F (36.3 C)   Weight: 237 lb (107.5 kg)   Height:  5' 4.5 (1.638 m)   Fetal Status: Fetal Heart Rate (bpm): 160   Movement: Absent      Physical Exam Vitals and nursing note reviewed. Exam conducted with a chaperone present (Katie Eilleen Primrose RN).  Constitutional:      General: She is not in acute distress.    Appearance: Normal appearance. She is well-developed.  HENT:     Head: Normocephalic and atraumatic.     Right Ear: External ear normal.     Left Ear: External ear normal.     Nose: Nose normal. No congestion or rhinorrhea.     Mouth/Throat:     Lips: Pink.     Mouth: Mucous membranes are moist.     Dentition: Normal dentition. No dental caries.     Pharynx: Oropharynx is clear. Uvula midline.     Comments: Dentition: good  Eyes:     General: No scleral icterus.    Conjunctiva/sclera: Conjunctivae normal.  Neck:     Thyroid: No thyroid mass or thyromegaly.  Cardiovascular:     Rate and Rhythm: Normal rate.     Pulses: Normal pulses.     Comments: Extremities are warm and well perfused Pulmonary:     Effort: Pulmonary effort is normal.     Breath sounds: Normal breath sounds.  Chest:  Chest wall: No mass.  Breasts:    Tanner Score is 5.     Breasts are symmetrical.     Right: Normal. No mass, nipple discharge or skin change.     Left: Normal. No mass, nipple discharge or skin change.  Abdominal:     General: Abdomen is flat.     Palpations: Abdomen is soft.     Tenderness: There is no abdominal tenderness.     Comments: Gravid   Genitourinary:    Uterus: Enlarged (Gravid 11 size).      Comments: Pelvic exam not  indicated Musculoskeletal:     Right lower leg: No edema.     Left lower leg: No edema.  Lymphadenopathy:     Cervical: No cervical adenopathy.     Upper Body:     Right upper body: No axillary adenopathy.     Left upper body: No axillary adenopathy.  Skin:    General: Skin is warm.     Capillary Refill: Capillary refill takes less than 2 seconds.  Neurological:     Mental Status: She is alert.     Assessment and Plan:  Pregnancy: G1P0000 at [redacted]w[redacted]d  1. Supervision of normal first pregnancy, antepartum (Primary) 28 year old female in clinic today for prenatal care. Anxious but happy- support from her mom and dad. -dating by LMP (confirmed with U/S 10/15) -Patient states she is taking PNV daily. -Patient reports some nausea and vomiting in AM- resource sheet given- reviewed Vitamin B6 as first line tx -Patient received dental care years ago.  Patient given dental information.   -Patient states she received COVID vaccines in the past x 3- reviewed recommendation for booster shot.  Patient to receive Flu vaccine today. -EDPS= 7, will continue to monitor.  -Hgb= wnl    - Prenatal Profile I - Lead, blood (adult age 32 yrs or greater) - Hemoglobinopathy evaluation -[id number] - Hemoglobin, fingerstick - Inheritest 14-Gene Panel - MaterniT21 PLUS Core  2. Obesity affecting pregnancy, antepartum, unspecified obesity type -ASA indicated- accepts- dispensed to start 07/02/24 -total weight gain of 11-20# discussed today -encouraged exercise such as walking 30 mins daily  - aspirin EC 81 MG tablet; Take 1 tablet (81 mg total) by mouth daily. Swallow whole.  3. Subchorionic hemorrhage of placenta in first trimester -seen on US  10/15 (likely cause of bleeding) -continues to have intermittent spotting, reviewed bleeding precautions for when to return to clinic/ER  4. Chronic cervicitis -colpo in May 2025 with CIN1- repeat pap in 1 year   5. Family history of Down syndrome -reports  partners brother may have Down syndrome and her second cousin has Down syndrome -reviewed NIPT test -discuss GC at RV  - MaterniT21 PLUS Core  6. History of asthma -last reported albuterol use at age 61 -no wheezing today, but reports exertional SOB- likely physiologic with pregnancy      Discussed overview of care and coordination with inpatient delivery practices including [facility],  [facility].   Reviewed Centering pregnancy as standard of care at [facility], oriented to room and showed video. Based on EDD, plan for Cycle  .   Preterm labor symptoms and general obstetric precautions including but not limited to vaginal bleeding, contractions, leaking of fluid and fetal movement were reviewed in detail with the patient.  Please refer to After Visit Summary for other counseling recommendations.   Return in about 4 weeks (around 07/26/2024) for Routine Prenatal Care.  No future appointments.  [place],  FNP

## 2024-06-29 LAB — PROTEIN / CREATININE RATIO, URINE
Creatinine, Urine: 176.3 mg/dL
Protein, Ur: 45.4 mg/dL
Protein/Creat Ratio: 258 mg/g{creat} — ABNORMAL HIGH (ref 0–200)

## 2024-07-01 ENCOUNTER — Ambulatory Visit: Payer: Self-pay | Admitting: Family Medicine

## 2024-07-01 DIAGNOSIS — Z3400 Encounter for supervision of normal first pregnancy, unspecified trimester: Secondary | ICD-10-CM

## 2024-07-02 ENCOUNTER — Encounter

## 2024-07-10 LAB — MICROSCOPIC EXAMINATION
Casts: NONE SEEN /LPF
Epithelial Cells (non renal): >10 /HPF — AB (ref 0–10)
RBC, Urine: NONE SEEN /HPF (ref 0–2)

## 2024-07-10 LAB — HGB A1C W/O EAG: Hgb A1c MFr Bld: 5.3 % (ref 4.8–5.6)

## 2024-07-10 LAB — MATERNIT 21 PLUS CORE, BLOOD
Fetal Fraction: 15
Result (T21): NEGATIVE
Trisomy 13 (Patau syndrome): NEGATIVE
Trisomy 18 (Edwards syndrome): NEGATIVE
Trisomy 21 (Down syndrome): NEGATIVE

## 2024-07-10 LAB — PREGNANCY, INITIAL SCREEN
Antibody Screen: NEGATIVE
Basophils Absolute: 0.1 x10E3/uL (ref 0.0–0.2)
Basos: 1 %
Bilirubin, UA: NEGATIVE
Chlamydia trachomatis, NAA: NEGATIVE
EOS (ABSOLUTE): 0.2 x10E3/uL (ref 0.0–0.4)
Eos: 3 %
Glucose, UA: NEGATIVE
HCV Ab: NONREACTIVE
HIV Screen 4th Generation wRfx: NONREACTIVE
Hematocrit: 41.5 % (ref 34.0–46.6)
Hemoglobin: 14 g/dL (ref 11.1–15.9)
Hepatitis B Surface Ag: NEGATIVE
Immature Grans (Abs): 0.1 x10E3/uL (ref 0.0–0.1)
Immature Granulocytes: 1 %
Lymphocytes Absolute: 1.9 x10E3/uL (ref 0.7–3.1)
Lymphs: 23 %
MCH: 30.4 pg (ref 26.6–33.0)
MCHC: 33.7 g/dL (ref 31.5–35.7)
MCV: 90 fL (ref 79–97)
Monocytes Absolute: 0.5 x10E3/uL (ref 0.1–0.9)
Monocytes: 6 %
Neisseria Gonorrhoeae by PCR: NEGATIVE
Neutrophils Absolute: 5.5 x10E3/uL (ref 1.4–7.0)
Neutrophils: 66 %
Nitrite, UA: NEGATIVE
Platelets: 261 x10E3/uL (ref 150–450)
RBC, UA: NEGATIVE
RBC: 4.61 x10E6/uL (ref 3.77–5.28)
RDW: 12.7 % (ref 11.7–15.4)
RPR Ser Ql: NONREACTIVE
Rh Factor: POSITIVE
Rubella Antibodies, IGG: 1.06 {index} (ref >0.99)
Specific Gravity, UA: 1.025 (ref 1.005–1.030)
Urobilinogen, Ur: 1 mg/dL (ref 0.2–1.0)
WBC: 8.2 x10E3/uL (ref 3.4–10.8)
pH, UA: 7 (ref 5.0–7.5)

## 2024-07-10 LAB — HCV INTERPRETATION

## 2024-07-10 LAB — COMPREHENSIVE METABOLIC PANEL WITH GFR
ALT: 14 IU/L (ref 0–32)
AST: 13 IU/L (ref 0–40)
Albumin: 4.1 g/dL (ref 4.0–5.0)
Alkaline Phosphatase: 45 IU/L (ref 41–116)
BUN/Creatinine Ratio: 16 (ref 9–23)
BUN: 8 mg/dL (ref 6–20)
Bilirubin Total: 0.4 mg/dL (ref 0.0–1.2)
CO2: 21 mmol/L (ref 20–29)
Calcium: 9.5 mg/dL (ref 8.7–10.2)
Chloride: 101 mmol/L (ref 96–106)
Creatinine, Ser: 0.49 mg/dL — ABNORMAL LOW (ref 0.57–1.00)
Globulin, Total: 2.7 g/dL (ref 1.5–4.5)
Glucose: 95 mg/dL (ref 70–99)
Potassium: 3.8 mmol/L (ref 3.5–5.2)
Sodium: 136 mmol/L (ref 134–144)
Total Protein: 6.8 g/dL (ref 6.0–8.5)
eGFR: 132 mL/min/1.73 (ref >59)

## 2024-07-10 LAB — BEACON CARRIER SCREEN;14 GENES

## 2024-07-10 LAB — URINE CULTURE, OB REFLEX

## 2024-07-10 LAB — HGB FRACTIONATION CASCADE
Hgb A2: 2.5 % (ref 1.8–3.2)
Hgb A: 97.5 % (ref 96.4–98.8)
Hgb F: 0 % (ref 0.0–2.0)
Hgb S: 0 %

## 2024-07-10 LAB — LEAD, BLOOD (ADULT >= 16 YRS): Lead-Whole Blood: <1 ug/dL (ref 0.0–3.4)

## 2024-07-10 LAB — GLUCOSE, 1 HOUR GESTATIONAL: Gestational Diabetes Screen: 91 mg/dL (ref 70–139)

## 2024-07-10 LAB — TSH: TSH: 0.727 u[IU]/mL (ref 0.450–4.500)

## 2024-07-11 ENCOUNTER — Telehealth: Payer: Self-pay

## 2024-07-11 NOTE — Telephone Encounter (Signed)
 Received message that patient cannot come to Centering on 07/24/24 and would like a revisit in clinic. LM with number to call back.Izetta Parish, RN

## 2024-07-14 ENCOUNTER — Encounter: Payer: Self-pay | Admitting: Family Medicine

## 2024-07-23 ENCOUNTER — Encounter: Payer: Self-pay | Admitting: Family Medicine

## 2024-07-23 ENCOUNTER — Ambulatory Visit: Admitting: Family Medicine

## 2024-07-23 VITALS — BP 109/68 | HR 82 | Temp 97.3°F | Wt 243.2 lb

## 2024-07-23 DIAGNOSIS — Z3A15 15 weeks gestation of pregnancy: Secondary | ICD-10-CM

## 2024-07-23 DIAGNOSIS — Z8279 Family history of other congenital malformations, deformations and chromosomal abnormalities: Secondary | ICD-10-CM

## 2024-07-23 DIAGNOSIS — Z3400 Encounter for supervision of normal first pregnancy, unspecified trimester: Secondary | ICD-10-CM

## 2024-07-23 DIAGNOSIS — L299 Pruritus, unspecified: Secondary | ICD-10-CM

## 2024-07-23 NOTE — Progress Notes (Signed)
 Patient here for MH RV at 15 weeks. Still considering pediatrician. Desires AFP today and needs urine prot/creat today. Hulan Parish, RN

## 2024-07-23 NOTE — Progress Notes (Signed)
 [facility] [address], [address] [place] [address] Main phone: [phone]  Prenatal Visit  Subjective:  Angela Lawrence is a 28 y.o. G1P0000 at [redacted]w[redacted]d being seen today for ongoing prenatal care.  She is currently monitored for the following issues for this low-risk pregnancy:   Patient Active Problem List   Diagnosis Date Noted   Supervision of normal first pregnancy, antepartum 06/28/2024   Obesity affecting pregnancy, antepartum, pregravid BMI= 38.8 06/28/2024   Subchorionic hemorrhage of placenta in first trimester 06/28/2024   Family history of Down syndrome 06/28/2024   Chronic cervicitis 06/28/2024   History of asthma 06/28/2024   PCOS (polycystic ovarian syndrome) 12/11/2023   Vitamin D deficiency 12/11/2023   Acanthosis nigricans 12/11/2023   Genital warts 11/17/2022   HPI Patient reports NV after eating meals, mandarin fruit helps relief her nausea, swelling around ankles while standing during her workday, and  Intermittent itching all over particularly on legs stomach, back and face, she has not tried any moisturizers to alleviate itching. She has not used any new detergents or soaps lately. This itching on her skin started ~ 1 month ago.Pt Denies leaking of fluid/ROM or vaginal bleeding.   The following portions of the patient's history were reviewed and updated as appropriate: allergies, current medications, past family history, past medical history, past social history, past surgical history and problem list. Problem list updated.  Objective:   Vitals:   07/23/24 1305  BP: 109/68  Pulse: 82  Temp: (!) 97.3 F (36.3 C)  Weight: 243 lb 3.2 oz (110.3 kg)    Fetal Status: Fetal Heart Rate (bpm):  (FHT visualized by butterfly bedside US .) Fundal Height:  (one finger breath below the umbilicus) Movement: Absent     General:  Alert, oriented and cooperative. Patient is in no acute distress.   Skin: Skin is warm and dry. No rash noted.   Cardiovascular: Normal heart rate noted  Respiratory: Normal respiratory effort, no problems with respiration noted  Abdomen: Soft, gravid, appropriate for gestational age.  Pain/Pressure: Absent     Pelvic: Cervical exam deferred        Extremities: Normal range of motion.  Edema: Trace  Mental Status: Normal mood and affect. Normal behavior. Normal judgment and thought content.   Assessment and Plan:  Pregnancy: G1P0000 at [redacted]w[redacted]d  1. [redacted] weeks gestation of pregnancy (Primary) -RV in 4 weeks.   2. Supervision of normal first pregnancy, antepartum  -TWG: 13 lb 3.2 oz (5.987 kg) Has gained 6  lbs in 4 weeks.   -Continues to take PNV and low-dose ASA without concerns.  - Recommended OTC Vitamin b6, ginger tea, eating small frequent meals, and saltine crackers at bedside for NV symptoms. PT able to tolerate meals, PO fluids adequately 4-5 bottles of water per/day, low-threshold concern for dehydration. -Advised for swelling to implement compression stockings, proper footwear, and elevating feet at night. -Referral for [facility] at [facility] placed 07/24/24.  - AFP, Serum, Open Spina Bifida - Protein / creatinine ratio, urine  3. Family history of Down syndrome -Reviewed negative NIPS and carrier screen results. - Offered Genetic Counseling, pt declined.   4. Pruritus -Baseline labs ordered at visit, will continue to trend as needed if pruritus persists. -Encouraged to use CeraVe moisturizer to help with suspected dry skin. - Bile acids, total - Comprehensive metabolic panel with GFR   Preterm labor symptoms and general obstetric precautions including but not limited to vaginal bleeding, contractions,  leaking of fluid and fetal movement were reviewed in detail with the patient. Please refer to After Visit Summary for other counseling recommendations.  No follow-ups on file.  Future Appointments  Date Time Provider Department Center   08/20/2024  9:30 AM [facility] PROVIDER [facility] None  08/26/2024 11:00 AM [facility] PROVIDER [facility] None    Dan Scearce GORMAN Pouch, NP  Attestation of Supervision of Advanced Practitioner (CNM/PA/NP): Evaluation and management procedures were performed by the Advanced Practice Provider under my supervision and collaboration.  I have reviewed the Advanced Practice Provider's note and chart, and I agree with the management and plan. I have also made any necessary editorial changes.   I was working along side this practitioner all day and all medical plans were discussed with me.   Verneta Bers, [place]

## 2024-07-24 ENCOUNTER — Ambulatory Visit

## 2024-07-27 LAB — COMPREHENSIVE METABOLIC PANEL WITH GFR
ALT: 13 IU/L (ref 0–32)
AST: 12 IU/L (ref 0–40)
Albumin: 4.1 g/dL (ref 4.0–5.0)
Alkaline Phosphatase: 47 IU/L (ref 41–116)
BUN/Creatinine Ratio: 13 (ref 9–23)
BUN: 7 mg/dL (ref 6–20)
Bilirubin Total: 0.3 mg/dL (ref 0.0–1.2)
CO2: 22 mmol/L (ref 20–29)
Calcium: 9.5 mg/dL (ref 8.7–10.2)
Chloride: 100 mmol/L (ref 96–106)
Creatinine, Ser: 0.52 mg/dL — ABNORMAL LOW (ref 0.57–1.00)
Globulin, Total: 2.5 g/dL (ref 1.5–4.5)
Glucose: 110 mg/dL — ABNORMAL HIGH (ref 70–99)
Potassium: 3.8 mmol/L (ref 3.5–5.2)
Sodium: 136 mmol/L (ref 134–144)
Total Protein: 6.6 g/dL (ref 6.0–8.5)
eGFR: 131 mL/min/1.73 (ref >59)

## 2024-07-27 LAB — AFP, SERUM, OPEN SPINA BIFIDA
AFP MoM: 1.26
AFP Value: 27 ng/mL
Gest. Age on Collection Date: 15 wk
Maternal Age At EDD: 27.5 a
OSBR Risk 1 IN: 5411
Test Results:: NEGATIVE
Weight: 243 [lb_av]

## 2024-07-27 LAB — PROTEIN / CREATININE RATIO, URINE
Creatinine, Urine: 90.3 mg/dL
Protein, Ur: 9 mg/dL
Protein/Creat Ratio: 100 mg/g{creat} (ref 0–200)

## 2024-07-27 LAB — BILE ACIDS, TOTAL: Bile Acids Total: 3 umol/L (ref 0.0–10.0)

## 2024-07-29 ENCOUNTER — Ambulatory Visit: Payer: Self-pay | Admitting: Family Medicine

## 2024-07-29 DIAGNOSIS — Z3400 Encounter for supervision of normal first pregnancy, unspecified trimester: Secondary | ICD-10-CM

## 2024-08-01 NOTE — Addendum Note (Signed)
 Addended by: Randeep Biondolillo on: 08/01/2024 01:50 PM   Modules accepted: Orders

## 2024-08-19 ENCOUNTER — Telehealth: Payer: Self-pay | Admitting: Family Medicine

## 2024-08-20 ENCOUNTER — Ambulatory Visit

## 2024-08-26 ENCOUNTER — Ambulatory Visit: Admitting: Nurse Practitioner

## 2024-08-26 VITALS — BP 128/75 | HR 100 | Temp 97.7°F | Wt 245.8 lb

## 2024-08-26 DIAGNOSIS — L709 Acne, unspecified: Secondary | ICD-10-CM

## 2024-08-26 DIAGNOSIS — O99210 Obesity complicating pregnancy, unspecified trimester: Secondary | ICD-10-CM

## 2024-08-26 DIAGNOSIS — Z3A19 19 weeks gestation of pregnancy: Secondary | ICD-10-CM

## 2024-08-26 DIAGNOSIS — Z3400 Encounter for supervision of normal first pregnancy, unspecified trimester: Secondary | ICD-10-CM

## 2024-08-26 DIAGNOSIS — O99212 Obesity complicating pregnancy, second trimester: Secondary | ICD-10-CM

## 2024-08-26 DIAGNOSIS — Z3402 Encounter for supervision of normal first pregnancy, second trimester: Secondary | ICD-10-CM

## 2024-08-26 NOTE — Progress Notes (Signed)
 " [facility] [address], [address] [place] [address] Main phone: [phone]  Prenatal Visit  Subjective:  Angela Lawrence is a 28 y.o. G1P0000 at [redacted]w[redacted]d being seen today for ongoing prenatal care.  She is currently monitored for the following issues for this low-risk pregnancy:   Patient Active Problem List   Diagnosis Date Noted   Supervision of normal first pregnancy, antepartum 06/28/2024   Obesity affecting pregnancy, antepartum, pregravid BMI= 38.8 06/28/2024   Subchorionic hemorrhage of placenta in first trimester 06/28/2024   Family history of Down syndrome 06/28/2024   Chronic cervicitis 06/28/2024   History of asthma 06/28/2024   PCOS (polycystic ovarian syndrome) 12/11/2023   Vitamin D deficiency 12/11/2023   Acanthosis nigricans 12/11/2023   Genital warts 11/17/2022   HPI Patient reports itching.  Contractions: Not present. Vag. Bleeding: None.  Movement: Absent. Denies leaking of fluid/ROM.   The following portions of the patient's history were reviewed and updated as appropriate: allergies, current medications, past family history, past medical history, past social history, past surgical history and problem list. Problem list updated.  Objective:   Vitals:   08/26/24 1050  BP: 128/75  Pulse: 100  Temp: 97.7 F (36.5 C)  Weight: 245 lb 12.8 oz (111.5 kg)   Total weight gain from pre-pregnancy weight: 15 lb 12.8 oz (7.167 kg)  Fetal Status: Fetal Heart Rate (bpm): 145 Fundal Height: 20 cm Movement: Absent    Fundal height trends reviewed - appropriate for EGA  General:  Alert, oriented and cooperative. Patient is in no acute distress.  Skin: Skin is warm and dry. No rash noted.   Cardiovascular: Normal heart rate noted  Respiratory: Normal respiratory effort, no problems with respiration noted  Abdomen: Soft, gravid, appropriate for gestational age.  Pain/Pressure: Present     Pelvic:  Cervical exam deferred        Extremities: Normal range of motion.     Mental Status: Normal mood and affect. Normal behavior. Normal judgment and thought content.   Assessment and Plan:  Pregnancy: G1P0000 at [redacted]w[redacted]d  1. [redacted] weeks gestation of pregnancy (Primary)  2. Supervision of normal first pregnancy, antepartum  - Has had sore throat, congestion. No fever. Discussed comfort measures, Tylenol, lots of fluids. Has medication list. - Constipation x 1 week, drinking lots of water. Endorses lower abdominal discomfort. Last BM was roughly on the 24th of December (about 5 days). She has not been eating normally due to holiday celebrations. Constipation handout given. Advised adding Miralax for next few days to induce a bowel movement, adding daily fiber supplement such as Metamucil if continues.  - Itching continues on legs, belly arms but not worsening. Baseline bile acids/CMP normal at last visit on 07/23/24. Using moisturizer and it is helping some. When she itches, area becomes raised and a little puffy. Itching not on the hands/palms, sometimes on soles of feet. Does have skin peeling on her feet. Advised applying heavy moisturizer to her feet and applying socks afterward. Also advised use of hydrocortisone on itchy areas as needed.  - Note given for work: she is sometimes required to lift ~30 lbs, advised employer to limit her lifting to 10 lbs - Anatomy US  scheduled for 09/06/24  3. Obesity affecting pregnancy, antepartum, unspecified obesity type  - Taking aspirin  daily - TWG 15 lbs, 2 lb weight gain in 1 month - MNT referral sent. Did get some information on nutrition from [facility].   4. Acne,  unspecified acne type  - 1.5 cm non-tender, fluctuating cyst on left cheek that appeared around 1.5 months ago. Has not drained any fluid. Has acne, appears to be a small cyst.  - Advised warm compresses, not picking at area, topical salicylic acid/benzoyl peroxide and avoiding Retinoids. - Ambulatory  referral to Dermatology  Preterm labor symptoms and general obstetric precautions including but not limited to vaginal bleeding, contractions, leaking of fluid and fetal movement were reviewed in detail with the patient. Please refer to After Visit Summary for other counseling recommendations.   Return in 24 days (on 09/19/2024). Centering Pregnancy.  No future appointments.  Damien FORBES Satchel, NP "

## 2024-08-30 ENCOUNTER — Telehealth: Payer: Self-pay | Admitting: Professional Counselor

## 2024-09-03 ENCOUNTER — Encounter: Payer: Self-pay | Admitting: Nurse Practitioner

## 2024-09-03 ENCOUNTER — Telehealth: Payer: Self-pay | Admitting: Family Medicine

## 2024-09-03 ENCOUNTER — Telehealth: Payer: Self-pay

## 2024-09-03 ENCOUNTER — Other Ambulatory Visit: Payer: Self-pay | Admitting: Obstetrics and Gynecology

## 2024-09-03 DIAGNOSIS — Z6841 Body Mass Index (BMI) 40.0 and over, adult: Secondary | ICD-10-CM

## 2024-09-03 DIAGNOSIS — Z3402 Encounter for supervision of normal first pregnancy, second trimester: Secondary | ICD-10-CM

## 2024-09-03 NOTE — Telephone Encounter (Signed)
 [facility] called to notify [facility] that they are unavle to perform U/S because of BMI. Requested we fax records to [facility] [place]. Snapshot faxed to [facility] and confirmed receipt. BTHIELE RN

## 2024-09-03 NOTE — Telephone Encounter (Signed)
 Patient wants help with scheduling her ultrasound appointment, she had one in [facility] but got cancelled then she got another one in [place] but it is not till February. She would like to know if she could possibly get an ultrasound appt before February.

## 2024-09-04 ENCOUNTER — Telehealth: Payer: Self-pay

## 2024-09-04 NOTE — Telephone Encounter (Signed)
 Returned call to patient who called about U/S appointment. U/S appointment is 10/04/24 in [place] [place].  Patient would like an U/S sooner, but has already asked off for 10/04/24 at work. Patient

## 2024-09-05 NOTE — Telephone Encounter (Signed)
 Patient states she will keep 10/04/24 U/S appointment.Izetta Parish, RN

## 2024-09-06 NOTE — Telephone Encounter (Signed)
 Angela Lawrence called and aware of U/S appointment 10/04/2024. She states that she is on the Wait List if an appointment opens up sooner than this date. BTHIELE RN

## 2024-09-11 ENCOUNTER — Ambulatory Visit

## 2024-09-11 DIAGNOSIS — L700 Acne vulgaris: Secondary | ICD-10-CM

## 2024-09-11 DIAGNOSIS — Z3A22 22 weeks gestation of pregnancy: Secondary | ICD-10-CM

## 2024-09-11 DIAGNOSIS — L910 Hypertrophic scar: Secondary | ICD-10-CM | POA: Diagnosis not present

## 2024-09-11 MED ORDER — AZELAIC ACID 15 % EX GEL: CUTANEOUS | 5 refills | Status: AC

## 2024-09-11 MED ORDER — CLINDAMYCIN PHOSPHATE 1 % EX SWAB: CUTANEOUS | 5 refills | Status: AC

## 2024-09-11 NOTE — Patient Instructions (Addendum)
 We recommend an over-the-counter lotion called Amlactin.  This contains a mild acid exfoliant which can help minimize skin cell build-up.      Due to recent changes in healthcare laws, you may see results of your pathology and/or laboratory studies on MyChart before the doctors have had a chance to review them. We understand that in some cases there may be results that are confusing or concerning to you. Please understand that not all results are received at the same time and often the doctors may need to interpret multiple results in order to provide you with the best plan of care or course of treatment. Therefore, we ask that you please give us  2 business days to thoroughly review all your results before contacting the office for clarification. Should we see a critical lab result, you will be contacted sooner.   If You Need Anything After Your Visit  If you have any questions or concerns for your doctor, please call our main line at [phone] and press option 4 to reach your doctor's medical assistant. If no one answers, please leave a voicemail as directed and we will return your call as soon as possible. Messages left after 4 pm will be answered the following business day.   You may also send us  a message via MyChart. We typically respond to MyChart messages within 1-2 business days.  For prescription refills, please ask your pharmacy to contact our office. Our fax number is [phone].  If you have an urgent issue when the clinic is closed that cannot wait until the next business day, you can page your doctor at the number below.    Please note that while we do our best to be available for urgent issues outside of office hours, we are not available 24/7.   If you have an urgent issue and are unable to reach us , you may choose to seek medical care at your doctor's office, retail clinic, urgent care center, or emergency room.  If you have a medical emergency, please immediately call  911 or go to the emergency department.  Pager Numbers  - Dr. Hester: [phone]  - Dr. Jackquline: [phone]  - Dr. Claudene: [phone]   In the event of inclement weather, please call our main line at [phone] for an update on the status of any delays or closures.  Dermatology Medication Tips: Please keep the boxes that topical medications come in in order to help keep track of the instructions about where and how to use these. Pharmacies typically print the medication instructions only on the boxes and not directly on the medication tubes.   If your medication is too expensive, please contact our office at [phone] option 4 or send us  a message through MyChart.   We are unable to tell what your co-pay for medications will be in advance as this is different depending on your insurance coverage. However, we may be able to find a substitute medication at lower cost or fill out paperwork to get insurance to cover a needed medication.   If a prior authorization is required to get your medication covered by your insurance company, please allow us  1-2 business days to complete this process.  Drug prices often vary depending on where the prescription is filled and some pharmacies may offer cheaper prices.  The website www.goodrx.com contains coupons for medications through different pharmacies. The prices here do not account for what the cost may be with help from insurance (it may be cheaper with your insurance),  but the website can give you the price if you did not use any insurance.  - You can print the associated coupon and take it with your prescription to the pharmacy.  - You may also stop by our office during regular business hours and pick up a GoodRx coupon card.  - If you need your prescription sent electronically to a different pharmacy, notify our office through [facility] or by phone at [phone] option 4.     Si Usted Necesita Algo Despus de Su  Visita  Tambin puede enviarnos un mensaje a travs de Clinical cytogeneticist. Por lo general respondemos a los mensajes de MyChart en el transcurso de 1 a 2 das hbiles.  Para renovar recetas, por favor pida a su farmacia que se ponga en contacto con nuestra oficina. Randi lakes de fax es [place] [phone].  Si tiene un asunto urgente cuando la clnica est cerrada y que no puede esperar hasta el siguiente da hbil, puede llamar/localizar a su doctor(a) al nmero que aparece a continuacin.   Por favor, tenga en cuenta que aunque hacemos todo lo posible para estar disponibles para asuntos urgentes fuera del horario de [place], no estamos disponibles las 24 horas del da, los 7 [address]  [address] de la [place].   Si tiene un problema urgente y no puede comunicarse con nosotros, puede optar por buscar atencin mdica  en el consultorio de su doctor(a), en una clnica privada, en un centro de atencin urgente o en una sala de emergencias.  Si tiene Engineer, drilling, por favor llame inmediatamente al 911 o vaya a la sala de emergencias.  Nmeros de bper  - Dr. Hester: [phone]  - Dra. Jackquline: [phone]  - Dr. Claudene: [phone]   En caso de inclemencias del tiempo, por favor llame a landry capes principal al [phone] para una actualizacin sobre el [place] de cualquier retraso o cierre.  Consejos para la medicacin en dermatologa: Por favor, guarde las cajas en las que vienen los medicamentos de uso tpico para ayudarle a seguir las instrucciones sobre dnde y cmo usarlos. Las farmacias generalmente imprimen las instrucciones del medicamento slo en las cajas y no directamente en los tubos del [place].   Si su medicamento es muy caro, por favor, pngase en contacto con landry rieger llamando al [phone] y presione la opcin 4 o envenos un mensaje a travs de Clinical cytogeneticist.   No podemos decirle cul ser su copago por los medicamentos por adelantado ya que esto es diferente dependiendo de  la cobertura de su seguro. Sin embargo, es posible que podamos encontrar un medicamento sustituto a Audiological scientist un formulario para que el seguro cubra el medicamento que se considera necesario.   Si se requiere una autorizacin previa para que su compaa de seguros malta su medicamento, por favor permtanos de 1 a 2 das hbiles para completar este proceso.  Los precios de los medicamentos varan con frecuencia dependiendo del Environmental consultant de dnde se surte la receta y alguna farmacias pueden ofrecer precios ms baratos.  El sitio web www.goodrx.com tiene cupones para medicamentos de Health and safety inspector. Los precios aqu no tienen en cuenta lo que podra costar con la ayuda del seguro (puede ser ms barato con su seguro), pero el sitio web puede darle el precio si no utiliz Tourist information centre manager.  - Puede imprimir el cupn correspondiente y llevarlo con su receta a la farmacia.  - Tambin puede pasar por nuestra oficina durante el horario de atencin regular y Education officer, museum una tarjeta de  cupones de GoodRx.  - Si necesita que su receta se enve electrnicamente a una farmacia diferente, informe a nuestra oficina a travs de MyChart de [place] o por telfono llamando al [phone] y presione la opcin 4.

## 2024-09-11 NOTE — Progress Notes (Signed)
" °  °  Subjective   Angela Lawrence is a 29 y.o. female who presents for the following: acne concerns. Patient is new patient  Today patient reports: Patient states acne concerns some on body mostly on face. Has been having acne since a teenager. Has tried curology slightly helped but stopped used due to pregnancy. Possible cyst on left side of jaw. Patient has concerns of dark pigmentation in bilateral axilla and thighs. Patient is currently [redacted] weeks pregnant.  Review of Systems:    No other skin or systemic complaints except as noted in HPI or Assessment and Plan.  The following portions of the chart were reviewed this encounter and updated as appropriate: medications, allergies, medical history  Relevant Medical History:  n/a   Objective  (SKPE) Well appearing patient in no apparent distress; mood and affect are within normal limits. Examination was performed of the: Focused Exam of: face   Examination notable for: acne scarring on face, inflamed papules Keloid L jawline  Velvety brown plaques in axilla  Examination limited by: Undergarments, Shoes or socks , and Clothing     Assessment & Plan  (SKAP)   Acne vulgaris - fave with scarring, keloidal papule L jawline Currently pregnant  - Discussed various treatment options with patient, as well as need for consistent use for at least 6-12 weeks for full efficacy.  - Reviewed treatment options, including side effects of topical agents, oral antibiotics, OCPs (if female), oral spironolactone (if female), and isotretinoin. Discussed that isotretinoin is the most effective. Discussed limited acne tx options are safe in pregnancy but can re consider post partum  - continue azaleic acid  - Start topical clindamycin  swabs 1% once daily to active areas  - discussed could consider ilk of keloid post delivery   Acanthosis nigricans - axilla  Educated. Discussed importance of healthy weight and diet and potential association with  insulin resistance. Follow-up with PCP recommended for glycemic monitoring. States recent glucose checks have been normal States pregnancy glucose check was normaly  A1C reviewed from 07/10/24 wnl  Rec OTC Amlactin.    Was sun protection counseling provided?: No   Level of service outlined above   Patient instructions (SKPI)   Procedures, orders, diagnosis for this visit:    There are no diagnoses linked to this encounter.  Return to clinic: Return in about 5 months (around 02/17/2025) for Acne, w/ Dr. Raymund.  I, Almetta Nora, RMA, am acting as scribe for Lauraine JAYSON Raymund, MD .   Documentation: I have reviewed the above documentation for accuracy and completeness, and I agree with the above.  Lauraine JAYSON Raymund, MD  "

## 2024-09-18 ENCOUNTER — Telehealth: Payer: Self-pay | Admitting: Family Medicine

## 2024-09-19 ENCOUNTER — Ambulatory Visit

## 2024-09-24 ENCOUNTER — Ambulatory Visit

## 2024-09-24 VITALS — BP 122/83 | HR 97 | Temp 97.6°F | Wt 249.4 lb

## 2024-09-24 DIAGNOSIS — Z3400 Encounter for supervision of normal first pregnancy, unspecified trimester: Secondary | ICD-10-CM

## 2024-09-24 DIAGNOSIS — O99212 Obesity complicating pregnancy, second trimester: Secondary | ICD-10-CM

## 2024-09-24 DIAGNOSIS — Z3490 Encounter for supervision of normal pregnancy, unspecified, unspecified trimester: Secondary | ICD-10-CM

## 2024-09-24 DIAGNOSIS — O99210 Obesity complicating pregnancy, unspecified trimester: Secondary | ICD-10-CM

## 2024-09-24 DIAGNOSIS — Z3A24 24 weeks gestation of pregnancy: Secondary | ICD-10-CM

## 2024-09-24 DIAGNOSIS — Z3402 Encounter for supervision of normal first pregnancy, second trimester: Secondary | ICD-10-CM

## 2024-09-24 NOTE — Progress Notes (Signed)
 " [facility] [address], [address] [place] [address] Main phone: [phone]  Prenatal Visit  Subjective:  Angela Lawrence is a 29 y.o. G1P0000 at [redacted]w[redacted]d being seen today for ongoing prenatal care.  She is currently monitored for the following issues for this high-risk pregnancy:   Patient Active Problem List   Diagnosis Date Noted   Supervision of normal first pregnancy, antepartum 06/28/2024   Obesity affecting pregnancy, antepartum, pregravid BMI= 38.8 06/28/2024   Subchorionic hemorrhage of placenta in first trimester 06/28/2024   Family history of Down syndrome 06/28/2024   Chronic cervicitis 06/28/2024   History of asthma 06/28/2024   PCOS (polycystic ovarian syndrome) 12/11/2023   Vitamin D deficiency 12/11/2023   Acanthosis nigricans 12/11/2023   Genital warts 11/17/2022   HPI Patient reports cold symptoms (cough and congestion over the last 2 weeks).  Contractions: Not present. Vag. Bleeding: None.  Movement: Present (not regular- questions movements). Denies leaking of fluid/ROM.   The following portions of the patient's history were reviewed and updated as appropriate: allergies, current medications, past family history, past medical history, past social history, past surgical history and problem list. Problem list updated.  Objective:   Vitals:   09/24/24 1635  BP: 122/83  Pulse: 97  Temp: 97.6 F (36.4 C)  Weight: 249 lb 6.4 oz (113.1 kg)   Total weight gain from pre-pregnancy weight: 19 lb 6.4 oz (8.8 kg)  Fetal Status: Fetal Heart Rate (bpm): 159 Fundal Height: 27 cm Movement: Present (not regular- questions movements)    Fundal height trends reviewed - discordant from EGA  General:  Alert, oriented and cooperative. Patient is in no acute distress.  Skin: Skin is warm and dry. No rash noted.   Cardiovascular: Normal heart rate noted  Respiratory: Normal respiratory effort, no problems  with respiration noted  Abdomen: Soft, gravid, appropriate for gestational age.  Pain/Pressure: Absent     Pelvic: Cervical exam deferred        Extremities: Normal range of motion.  Edema: None  Mental Status: Normal mood and affect. Normal behavior. Normal judgment and thought content.   Assessment and Plan:  Pregnancy: G1P0000 at [redacted]w[redacted]d  1. [redacted] weeks gestation of pregnancy (Primary) -pt was to have US  at [facility] on 1/9- unable to do due to BMI -pt has appointment with [facility] for detailed U/S on 2/6- but due to staffing- they had to recently reschedule to 2/25 -pt reports some anxiety and upset over this  2. Supervision of normal first pregnancy, antepartum -reports cold symptoms over the last 2 weeks, reviewed safe meds in pregnancy -showed patient limited views of fetus with handheld butterfly US - counseled prior that I do not have formal training in ultrasonography- and cannot confirm the absence or presence of anomalies with this US - pt consented to limited bedside US   3. Obesity affecting pregnancy, antepartum, unspecified obesity type 19 lb 6.4 oz (8.8 kg) -taking ASA daily   4. Fundal height high for dates FH > dates, approx 27cm- will wait for US  for growth est.    Preterm labor symptoms and general obstetric precautions including but not limited to vaginal bleeding, contractions, leaking of fluid and fetal movement were reviewed in detail with the patient. Please refer to After Visit Summary for other counseling recommendations.  Return in about 4 weeks (around 10/22/2024) for Routine Prenatal Care.  Future Appointments  Date Time Provider Department Center  10/22/2024  1:20 PM [facility] PROVIDER [facility] None  10/23/2024  9:45 AM [facility] PROVIDER 1 [facility] [place]  10/23/2024 10:00 AM [facility] US  1 [facility] [place]  02/10/2025  4:00 PM Kitts, Lauraine BROCKS, MD [facility] None    Verneta Bers, [place] "

## 2024-09-24 NOTE — Progress Notes (Addendum)
 Per patient, schedule 09-06-24 u/s cancelled by [facility] due tp BMI, [facility] scheduled u/s for 10/04/24. Per patient MFM called patient and rescheduled u/s for 10/23/24 at 9:45 at [facility]. On wait list for appt cancellation. Appt reminder card given regarding 10-23-24 [facility]FM [place] appt and next [facility] RV for 10/22/2024. Sharman Lady RN

## 2024-10-04 ENCOUNTER — Ambulatory Visit

## 2024-10-22 ENCOUNTER — Ambulatory Visit

## 2024-10-23 ENCOUNTER — Other Ambulatory Visit

## 2024-10-24 ENCOUNTER — Other Ambulatory Visit

## 2024-10-24 ENCOUNTER — Ambulatory Visit

## 2025-02-10 ENCOUNTER — Ambulatory Visit
